# Patient Record
Sex: Female | Born: 1995
Health system: Southern US, Community
[De-identification: ages and names within clinical notes are randomized; demographics above are authoritative.]

## PROBLEM LIST (undated history)

## (undated) DIAGNOSIS — N920 Excessive and frequent menstruation with regular cycle: Secondary | ICD-10-CM

## (undated) DIAGNOSIS — N946 Dysmenorrhea, unspecified: Secondary | ICD-10-CM

## (undated) HISTORY — DX: Dysmenorrhea, unspecified: N94.6

## (undated) HISTORY — DX: Excessive and frequent menstruation with regular cycle: N92.0

---
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
## 2017-01-16 ENCOUNTER — Ambulatory Visit (INDEPENDENT_AMBULATORY_CARE_PROVIDER_SITE_OTHER): Payer: BLUE CROSS/BLUE SHIELD | Admitting: Obstetrics and Gynecology

## 2017-01-16 ENCOUNTER — Encounter: Payer: Self-pay | Admitting: Obstetrics and Gynecology

## 2017-01-16 VITALS — BP 116/68 | Ht 61.0 in | Wt 165.0 lb

## 2017-01-16 DIAGNOSIS — Z3041 Encounter for surveillance of contraceptive pills: Secondary | ICD-10-CM

## 2017-01-16 DIAGNOSIS — N946 Dysmenorrhea, unspecified: Secondary | ICD-10-CM | POA: Insufficient documentation

## 2017-01-16 DIAGNOSIS — R635 Abnormal weight gain: Secondary | ICD-10-CM

## 2017-01-16 DIAGNOSIS — Z01419 Encounter for gynecological examination (general) (routine) without abnormal findings: Secondary | ICD-10-CM

## 2017-01-16 DIAGNOSIS — Z113 Encounter for screening for infections with a predominantly sexual mode of transmission: Secondary | ICD-10-CM

## 2017-01-16 DIAGNOSIS — N920 Excessive and frequent menstruation with regular cycle: Secondary | ICD-10-CM | POA: Insufficient documentation

## 2017-01-16 MED ORDER — NORETHIN ACE-ETH ESTRAD-FE 1-20 MG-MCG PO TABS: 1.0000 | ORAL_TABLET | Freq: Every day | ORAL | 11 refills | Status: DC

## 2017-01-16 NOTE — Progress Notes (Signed)
HPI:      [name] is a 21 y.o. No obstetric history on file. who LMP was Patient's last menstrual period was 12/30/2016 (exact date)., presents today for her annual examination.  Her menses are regular every 28-30 days, lasting 3 days.  Dysmenorrhea none. She does have intermenstrual bleeding with late/missed OCPs.  Sex activity: single partner, contraception - oral OCPs and sometimes condoms.   Hx of STDs: none  There is no FH of breast cancer. There is no FH of ovarian cancer. The patient does not do self-breast exams.  Tobacco use: The patient denies current or previous tobacco use. Alcohol use: none Exercise: moderately active  She does not get adequate calcium and Vitamin D in her diet.  She complains of wt gain over the past few months and increased migraine headaches without aura for the past month. She has increased activity without wt loss. She thinks it's related to OCPs, and denies any change in her eating habits.  Past Medical History:  Diagnosis Date  . Dysmenorrhea   . Menorrhagia     No past surgical history on file.  No family history on file.   ROS:  Review of Systems  Constitutional: Negative for fever, malaise/fatigue and weight loss.  HENT: Negative for congestion, ear pain and sinus pain.   Respiratory: Negative for cough, shortness of breath and wheezing.   Cardiovascular: Negative for chest pain, orthopnea and leg swelling.  Gastrointestinal: Negative for constipation, diarrhea, nausea and vomiting.  Genitourinary: Negative for dysuria, frequency, hematuria and urgency.       Breast ROS: negative   Musculoskeletal: Negative for back pain, joint pain and myalgias.  Skin: Negative for itching and rash.  Neurological: Positive for headaches. Negative for dizziness, tingling and focal weakness.  Endo/Heme/Allergies: Negative for environmental allergies. Does not bruise/bleed easily.  Psychiatric/Behavioral: Negative for depression and  suicidal ideas. The patient is not nervous/anxious and does not have insomnia.     Objective: BP 116/68   Ht 5\' 1"  (1.549 m)   Wt 165 lb (74.8 kg)   LMP 12/30/2016 (Exact Date)   BMI 31.18 kg/m    Physical Exam  Constitutional: She is oriented to person, place, and time. She appears well-developed and well-nourished.  Genitourinary: Vagina normal and uterus normal. No erythema or tenderness in the vagina. No vaginal discharge found. Right adnexum does not display mass and does not display tenderness. Left adnexum does not display mass and does not display tenderness. Cervix does not exhibit motion tenderness or polyp. Uterus is not enlarged or tender.  Neck: Normal range of motion. No thyromegaly present.  Cardiovascular: Normal rate, regular rhythm and normal heart sounds.   No murmur heard. Pulmonary/Chest: Effort normal and breath sounds normal. Right breast exhibits no mass, no nipple discharge, no skin change and no tenderness. Left breast exhibits no mass, no nipple discharge, no skin change and no tenderness.  Abdominal: Soft. There is no tenderness. There is no guarding.  Musculoskeletal: Normal range of motion.  Neurological: She is alert and oriented to person, place, and time. No cranial nerve deficit.  Psychiatric: She has a normal mood and affect. Her behavior is normal.  Vitals reviewed.   Assessment/Plan: Encounter for annual routine gynecological examination  Screening for STD (sexually transmitted disease) - Plan: Chlamydia/Gonococcus/Trichomonas, NAA  Encounter for surveillance of contraceptive pills - Change OCPs to see if helps sx. Pt not interested in depo/nexplanon/IUD. - Plan: norethindrone-ethinyl estradiol (JUNEL FE 1/20) 1-20 MG-MCG tablet  Weight gain - 21# increase since last wk. Pt to monitor caloric intake/increased exercise/wt loss. Try different OCPs (most likely not related). F/u if no wt loss for labs.               GYN counsel STD prevention,  use and side effects of OCP's, adequate intake of calcium and vitamin D     F/U  Return in about 1 year (around 01/16/2018).  Latrise Bowland B. Gaynelle Pastrana, PA-C 01/16/2017 12:20 PM

## 2017-01-18 LAB — CHLAMYDIA/GONOCOCCUS/TRICHOMONAS, NAA
Chlamydia by NAA: NEGATIVE
Gonococcus by NAA: NEGATIVE
Trich vag by NAA: NEGATIVE

## 2017-03-24 ENCOUNTER — Emergency Department
Admission: EM | Admit: 2017-03-24 | Discharge: 2017-03-24 | Disposition: A | Discharge status: Home / Self Care | Payer: Self-pay | Attending: Emergency Medicine | Admitting: Emergency Medicine

## 2017-03-24 ENCOUNTER — Emergency Department: Payer: BLUE CROSS/BLUE SHIELD

## 2017-03-24 ENCOUNTER — Encounter: Payer: Self-pay | Admitting: Emergency Medicine

## 2017-03-24 DIAGNOSIS — N83209 Unspecified ovarian cyst, unspecified side: Secondary | ICD-10-CM

## 2017-03-24 DIAGNOSIS — R1031 Right lower quadrant pain: Secondary | ICD-10-CM

## 2017-03-24 DIAGNOSIS — N83202 Unspecified ovarian cyst, left side: Secondary | ICD-10-CM | POA: Insufficient documentation

## 2017-03-24 DIAGNOSIS — R102 Pelvic and perineal pain: Secondary | ICD-10-CM

## 2017-03-24 LAB — COMPREHENSIVE METABOLIC PANEL
ALK PHOS: 52 U/L (ref 38–126)
ALT: 16 U/L (ref 14–54)
AST: 21 U/L (ref 15–41)
Albumin: 4.4 g/dL (ref 3.5–5.0)
Anion gap: 6 (ref 5–15)
BUN: 15 mg/dL (ref 6–20)
CALCIUM: 9.4 mg/dL (ref 8.9–10.3)
CO2: 25 mmol/L (ref 22–32)
CREATININE: 0.71 mg/dL (ref 0.44–1.00)
Chloride: 106 mmol/L (ref 101–111)
GFR calc Af Amer: >60 mL/min (ref >60)
GFR calc non Af Amer: >60 mL/min (ref >60)
GLUCOSE: 92 mg/dL (ref 65–99)
Potassium: 4 mmol/L (ref 3.5–5.1)
SODIUM: 137 mmol/L (ref 135–145)
Total Bilirubin: 0.7 mg/dL (ref 0.3–1.2)
Total Protein: 7.5 g/dL (ref 6.5–8.1)

## 2017-03-24 LAB — URINALYSIS, COMPLETE (UACMP) WITH MICROSCOPIC
Bacteria, UA: NONE SEEN
Bilirubin Urine: NEGATIVE
Glucose, UA: NEGATIVE mg/dL
Hgb urine dipstick: NEGATIVE
KETONES UR: 5 mg/dL — AB
Leukocytes, UA: NEGATIVE
Nitrite: NEGATIVE
PH: 6 (ref 5.0–8.0)
Protein, ur: NEGATIVE mg/dL
SPECIFIC GRAVITY, URINE: 1.028 (ref 1.005–1.030)

## 2017-03-24 LAB — CBC
HCT: 41.6 % (ref 35.0–47.0)
Hemoglobin: 14.7 g/dL (ref 12.0–16.0)
MCH: 31.9 pg (ref 26.0–34.0)
MCHC: 35.3 g/dL (ref 32.0–36.0)
MCV: 90.4 fL (ref 80.0–100.0)
PLATELETS: 293 10*3/uL (ref 150–440)
RBC: 4.61 MIL/uL (ref 3.80–5.20)
RDW: 12 % (ref 11.5–14.5)
WBC: 6.5 10*3/uL (ref 3.6–11.0)

## 2017-03-24 LAB — POCT PREGNANCY, URINE: Preg Test, Ur: NEGATIVE

## 2017-03-24 LAB — LIPASE, BLOOD: LIPASE: 25 U/L (ref 11–51)

## 2017-03-24 MED ORDER — NAPROXEN SODIUM 550 MG PO TABS: 550.0000 mg | ORAL_TABLET | Freq: Two times a day (BID) | ORAL | 0 refills | Status: DC

## 2017-03-24 NOTE — ED Notes (Signed)
See triage note  States she woke up with lower abd cramping this am  States she had some intermittent lower back pain yesterday  describes  Pain as cramping  And pain was increased with standing and walking  No fever or n/v

## 2017-03-24 NOTE — ED Provider Notes (Signed)
[facility] Emergency Department Provider Note   ____________________________________________   First MD Initiated Contact with Patient 03/24/17 1148     (approximate)  I have reviewed the triage vital signs and the nursing notes.   HISTORY  Chief Complaint Abdominal Pain Mother   HPI Robin Heath is a 21 y.o. female is complaining of lower abdominal pain after waking up smiling. Patient reports it feels like a crampy, burning sensation. She denies any vaginal difficulties, vaginal discharge or urinary symptoms. Patient states that she took 3 ibuprofen this morning with some relief. She reports some nausea. There is been no vomiting or diarrhea. She denies any fever or chills. She was seen at [facility] and sent to the emergency department. Mother was also with patient and states that she went for her physical recently after being on birth control pills for approximately 1 year. Physical Exam was normal.  She was placed on birth control to help control her periods which were extremely heavy and painful causing her to miss school. Currently she rates her pain is 3/10.    Past Medical History:  Diagnosis Date  . Dysmenorrhea   . Menorrhagia     Patient Active Problem List   Diagnosis Date Noted  . Dysmenorrhea 01/16/2017  . Menorrhagia 01/16/2017    History reviewed. No pertinent surgical history.  Prior to Admission medications   Medication Sig Start Date End Date Taking? Authorizing Provider  naproxen sodium (ANAPROX DS) 550 MG tablet Take 1 tablet (550 mg total) by mouth 2 (two) times daily with a meal. 03/24/17   Tommi Rumps, PA-C  norethindrone-ethinyl estradiol (JUNEL FE 1/20) 1-20 MG-MCG tablet Take 1 tablet by mouth daily. 01/16/17   Copland, Ilona Sorrel, PA-C    Allergies Patient has no known allergies.  No family history on file.  Social History Social History  Substance Use Topics  . Smoking status: Never Smoker  .  Smokeless tobacco: Never Used  . Alcohol use No    Review of Systems Constitutional: No fever/chills Eyes: No visual changes. Cardiovascular: Denies chest pain. Respiratory: Denies shortness of breath. Gastrointestinal: Positive abdominal pain.  Positive nausea, no vomiting.  Musculoskeletal: Negative for back pain. Skin: Negative for rash. Neurological: Negative for headaches, focal weakness or numbness.  ____________________________________________   PHYSICAL EXAM:  VITAL SIGNS: ED Triage Vitals  Enc Vitals Group     BP 03/24/17 1041 117/67     Pulse Rate 03/24/17 1041 (!) 107     Resp 03/24/17 1041 17     Temp 03/24/17 1041 98.4 F (36.9 C)     Temp Source 03/24/17 1041 Oral     SpO2 03/24/17 1041 98 %     Weight 03/24/17 1042 160 lb (72.6 kg)     Height 03/24/17 1042 5\' 1"  (1.549 m)     Head Circumference --      Peak Flow --      Pain Score 03/24/17 1041 3     Pain Loc --      Pain Edu? --      Excl. in GC? --     Constitutional: Alert and oriented. Well appearing and in no acute distress. Eyes: Conjunctivae are normal. PERRL. EOMI. Head: Atraumatic. Neck: No stridor.   Cardiovascular: Normal rate, regular rhythm. Grossly normal heart sounds.  Good peripheral circulation. Respiratory: Normal respiratory effort.  No retractions. Lungs CTAB. Gastrointestinal: Soft and nontender. No distention.  No CVA tenderness.Bowel sounds are normoactive 4 quadrants. There is  some diffuse tenderness below the umbilicus and suprapubic area. There is both right and left lower quadrant tenderness with the right slightly greater than left. There is no McBurney's tenderness. No rebound tenderness. Musculoskeletal: Moves upper and lower extremities without any difficulty. No edema noted lower extremities. Neurologic:  Normal speech and language. No gross focal neurologic deficits are appreciated.  Skin:  Skin is warm, dry and intact. No rash noted. Psychiatric: Mood and affect are  normal. Speech and behavior are normal.  ____________________________________________   LABS (all labs ordered are listed, but only abnormal results are displayed)  Labs Reviewed  URINALYSIS, COMPLETE (UACMP) WITH MICROSCOPIC - Abnormal; Notable for the following:       Result Value   Color, Urine YELLOW (*)    APPearance CLEAR (*)    Ketones, ur 5 (*)    Squamous Epithelial / LPF 0-5 (*)    All other components within normal limits  LIPASE, BLOOD  COMPREHENSIVE METABOLIC PANEL  CBC  POC URINE PREG, ED  POCT PREGNANCY, URINE    RADIOLOGY  Transvaginal ultrasound per radiologist: IMPRESSION:  1. Moderate volume of hypoechoic complex free fluid in the pelvis.  Given an irregular hypoechoic thinly septated complex appearing cyst  associated with the left ovary measuring 3.7 x 4.1 x 4.9 cm,  findings could potentially be secondary to a ruptured left ovarian  cyst. As always, correlate to exclude pregnancy.  2. Unremarkable appearance of the right ovary.      ____________________________________________   PROCEDURES  Procedure(s) performed: None  Procedures  Critical Care performed: No  ____________________________________________   INITIAL IMPRESSION / ASSESSMENT AND PLAN / ED COURSE  Pertinent labs & imaging results that were available during my care of the patient were reviewed by me and considered in my medical decision making (see chart for details).  Discussed findings with patient and mother. Patient is given a prescription for Anaprox DS as needed for inflammation and pain. 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 Patient continues to rest in the exam room without any increased pain. She denies any nausea or vomiting. There is been no fever or chills. We discussed symptoms for appendicitis and patient is aware to return to the emergency room immediately if her symptoms worsen, nausea or vomiting, fever or chills,  or severe worsening of her pain. She will follow-up with her PCP if any continued problems.      ____________________________________________   FINAL CLINICAL IMPRESSION(S) / ED DIAGNOSES  Final diagnoses:  Pelvic pain  Ruptured ovarian cyst      NEW MEDICATIONS STARTED DURING THIS VISIT:  Discharge Medication List as of 03/24/2017  4:02 PM    START taking these medications   Details  naproxen sodium (ANAPROX DS) 550 MG tablet Take 1 tablet (550 mg total) by mouth 2 (two) times daily with a meal., Starting Mon 03/24/2017, Print         Note:  This document was prepared using Dragon voice recognition software and may include unintentional dictation errors.    Tommi RumpsSummers, Ples Trudel L, PA-C 03/24/17 1723    Arnaldo NatalMalinda, Paul F, MD 03/25/17 2322

## 2017-03-24 NOTE — Discharge Instructions (Signed)
Begin taking Anaprox as needed for discomfort. Follow-up with your doctor if any continued problems. Return to the emergency room immediately if any severe worsening of her symptoms or localized pain in the right lower quadrant. Nausea, vomiting, fever, or severe pain.

## 2017-03-24 NOTE — ED Notes (Signed)
Pt given gown and ask to remove clothing from waist down. Pt parent in room with pt.

## 2017-03-24 NOTE — ED Triage Notes (Signed)
Pt reports lower abdominal pain upon awakening this morning. Pt reports pain feels like cramping/burning. Pt reports taking 3 ibu this morning with some relief. Pt reports nausea. [facility]

## 2017-08-25 ENCOUNTER — Encounter: Payer: Self-pay | Admitting: Obstetrics and Gynecology

## 2017-08-25 ENCOUNTER — Ambulatory Visit (INDEPENDENT_AMBULATORY_CARE_PROVIDER_SITE_OTHER): Payer: Self-pay | Admitting: Obstetrics and Gynecology

## 2017-08-25 VITALS — BP 122/88 | HR 98 | Ht 61.0 in | Wt 173.0 lb

## 2017-08-25 DIAGNOSIS — Z1329 Encounter for screening for other suspected endocrine disorder: Secondary | ICD-10-CM

## 2017-08-25 DIAGNOSIS — R4586 Emotional lability: Secondary | ICD-10-CM

## 2017-08-25 DIAGNOSIS — Z131 Encounter for screening for diabetes mellitus: Secondary | ICD-10-CM

## 2017-08-25 DIAGNOSIS — N938 Other specified abnormal uterine and vaginal bleeding: Secondary | ICD-10-CM

## 2017-08-25 DIAGNOSIS — R635 Abnormal weight gain: Secondary | ICD-10-CM

## 2017-08-25 DIAGNOSIS — Z3041 Encounter for surveillance of contraceptive pills: Secondary | ICD-10-CM

## 2017-08-25 DIAGNOSIS — Z0000 Encounter for general adult medical examination without abnormal findings: Secondary | ICD-10-CM

## 2017-08-25 MED ORDER — LEVONORGESTREL-ETHINYL ESTRAD 0.1-20 MG-MCG PO TABS: 1.0000 | ORAL_TABLET | Freq: Every day | ORAL | 1 refills | Status: DC

## 2017-08-25 NOTE — Progress Notes (Addendum)
Chief Complaint  Patient presents with  . Menstrual Problem    HPI:      [name] is a 21 y.o. G0P0000 who LMP was Patient's last menstrual period was 07/19/2017., presents today for BTB on OCPs since 5/18. She had monthly menses lasting 3 days on aviane, but OCPs changed to junel 1/20 due to possible cause of wt gain 3/18. Since then, pt's menses have still been monthly but she has been having 3-4 days BTB each month. No late/missed OCPs. No pelvic pain, no urin sx, no vag sx. She is occas sex active, no new partners since 3/18. Had 2 neg UPTs. Had a poss ruptured RT ovar cyst 5/18.  Pt has also gained 8# since 3/18. She had gained about 19# from 8/17-3/18. She denies any diet/exercise changes. She complains of mood changes, worse on the junel 1/20 OCPs. No relation to cycle. Just has anger/agitation/emotional lability randomly, no aggrav factors. No increased stress.  Past Medical History:  Diagnosis Date  . Dysmenorrhea   . Menorrhagia     History reviewed. No pertinent surgical history.  History reviewed. No pertinent family history.  Social History   Social History  . Marital status: Single    Spouse name: N/A  . Number of children: N/A  . Years of education: N/A   Occupational History  . Not on file.   Social History Main Topics  . Smoking status: Never Smoker  . Smokeless tobacco: Never Used  . Alcohol use No  . Drug use: No  . Sexual activity: Yes    Birth control/ protection: Pill   Other Topics Concern  . Not on file   Social History Narrative  . No narrative on file     Current Outpatient Prescriptions:  .  levonorgestrel-ethinyl estradiol (AVIANE) 0.1-20 MG-MCG tablet, Take 1 tablet by mouth daily., Disp: 84 tablet, Rfl: 1 .  naproxen sodium (ANAPROX DS) 550 MG tablet, Take 1 tablet (550 mg total) by mouth 2 (two) times daily with a meal. (Patient not taking: Reported on 08/25/2017), Disp: 12 tablet, Rfl: 0   ROS:  Review of Systems    Constitutional: Negative for fever.  Gastrointestinal: Negative for blood in stool, constipation, diarrhea, nausea and vomiting.  Genitourinary: Negative for dyspareunia, dysuria, flank pain, frequency, hematuria, urgency, vaginal bleeding, vaginal discharge and vaginal pain.  Musculoskeletal: Negative for back pain.  Skin: Negative for rash.     OBJECTIVE:   Vitals:  BP 122/88   Pulse 98   Ht 5\' 1"  (1.549 m)   Wt 173 lb (78.5 kg)   LMP 07/19/2017   BMI 32.69 kg/m   Physical Exam  Constitutional: She is oriented to person, place, and time and well-developed, well-nourished, and in no distress.  Neurological: She is alert and oriented to person, place, and time.  Psychiatric: Memory, affect and judgment normal.  Vitals reviewed.    Assessment/Plan: DUB (dysfunctional uterine bleeding) - Check labs. Most likely due to OCP change. Restart aviane (pt had good cycle control on it). Rx eRxd. F/u prn.  - Plan: TSH + free T4, Prolactin, Hemoglobin A1c  Weight gain - Wt gain since 8/17. Check labs. Will f/u with results. Discussed [employer]/exercise changes.   Blood tests for routine general physical examination - Plan: TSH + free T4, Prolactin, Hemoglobin A1c  Thyroid disorder screening - Plan: TSH + free T4  Screening for diabetes mellitus - Plan: Hemoglobin A1c  Encounter for surveillance of contraceptive pills - Plan: levonorgestrel-ethinyl estradiol (  AVIANE) 0.1-20 MG-MCG tablet  Mood swings - See if sx improve wtih OCP change.    Meds ordered this encounter  Medications  . levonorgestrel-ethinyl estradiol (AVIANE) 0.1-20 MG-MCG tablet    Sig: Take 1 tablet by mouth daily.    Dispense:  84 tablet    Refill:  1      Return if symptoms worsen or fail to improve.  Alicia B. Copland, PA-C 08/25/2017 11:46 AM

## 2017-08-26 LAB — TSH+FREE T4
FREE T4: 1.26 ng/dL (ref 0.93–1.60)
TSH: 2.2 u[IU]/mL (ref 0.450–4.500)

## 2017-08-26 LAB — HEMOGLOBIN A1C
Est. average glucose Bld gHb Est-mCnc: 94 mg/dL
Hgb A1c MFr Bld: 4.9 % (ref 4.8–5.6)

## 2017-08-26 LAB — PROLACTIN: Prolactin: 12.4 ng/mL (ref 4.8–23.3)

## 2018-02-01 ENCOUNTER — Other Ambulatory Visit: Payer: Self-pay | Admitting: Obstetrics and Gynecology

## 2018-02-01 DIAGNOSIS — Z3041 Encounter for surveillance of contraceptive pills: Secondary | ICD-10-CM

## 2018-02-04 ENCOUNTER — Other Ambulatory Visit: Payer: Self-pay

## 2018-02-04 DIAGNOSIS — Z3041 Encounter for surveillance of contraceptive pills: Secondary | ICD-10-CM

## 2018-02-04 MED ORDER — LEVONORGESTREL-ETHINYL ESTRAD 0.1-20 MG-MCG PO TABS: 1.0000 | ORAL_TABLET | Freq: Every day | ORAL | 0 refills | Status: DC

## 2018-02-04 NOTE — Telephone Encounter (Signed)
Pt sched annual and needs refill of bc.  Pt aware refill eRx'd.

## 2018-02-25 ENCOUNTER — Ambulatory Visit: Payer: Self-pay | Admitting: Obstetrics and Gynecology

## 2018-03-10 ENCOUNTER — Ambulatory Visit: Payer: Self-pay | Admitting: Obstetrics and Gynecology

## 2018-03-27 IMAGING — US US TRANSVAGINAL NON-OB
1 series · 13 of 25 positions shown · non-contrast
Comparison: None

CLINICAL DATA: Right lower quadrant pelvic pain since 9 a.m. today.

EXAM:
TRANSABDOMINAL AND TRANSVAGINAL ULTRASOUND OF PELVIS
TECHNIQUE: Both transabdominal and transvaginal ultrasound examinations of the
pelvis were performed. Transabdominal technique was performed for
global imaging of the pelvis including uterus, ovaries, adnexal
regions, and pelvic cul-de-sac. It was necessary to proceed with
endovaginal exam following the transabdominal exam to visualize the
ovaries and adnexa.

[Series 1: us transvaginal non-ob · 0.17mm/px · 13 of 83 slices shown]
[im 1/83]
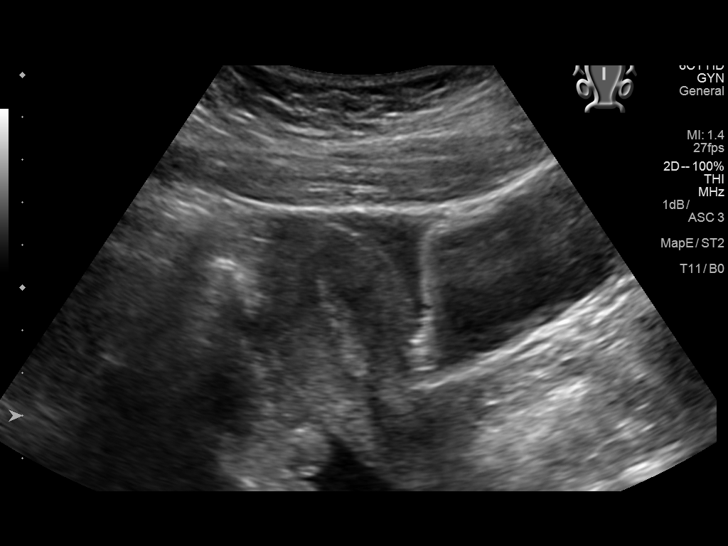
[im 7/83]
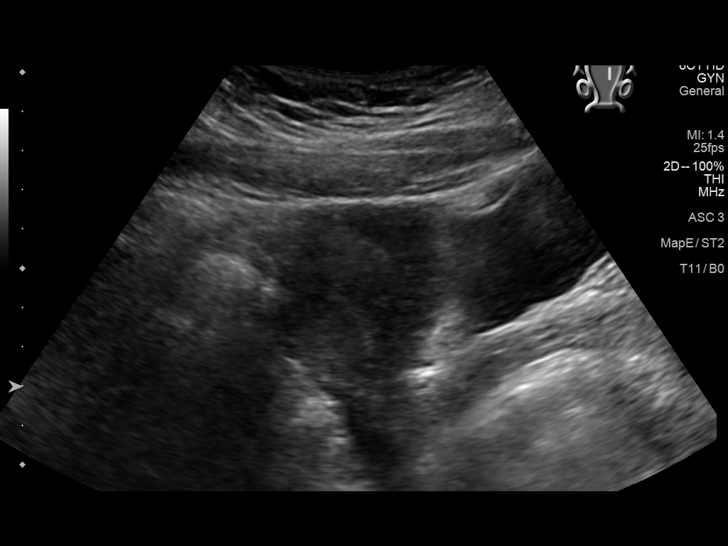
[im 14/83]
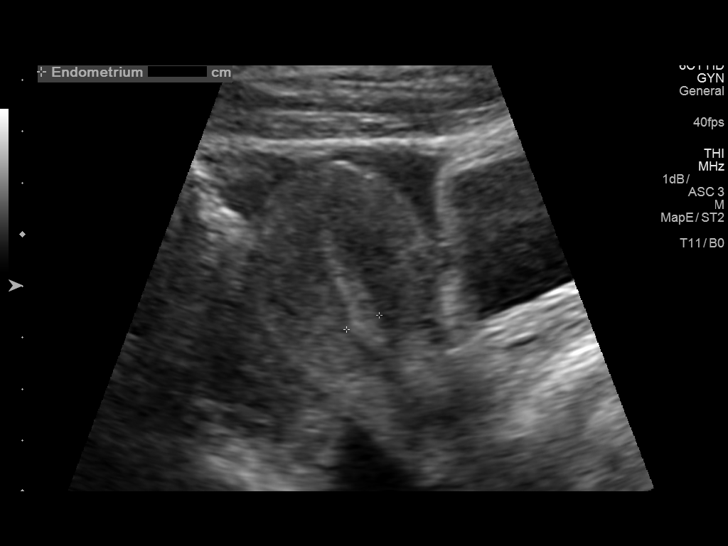
[im 21/83]
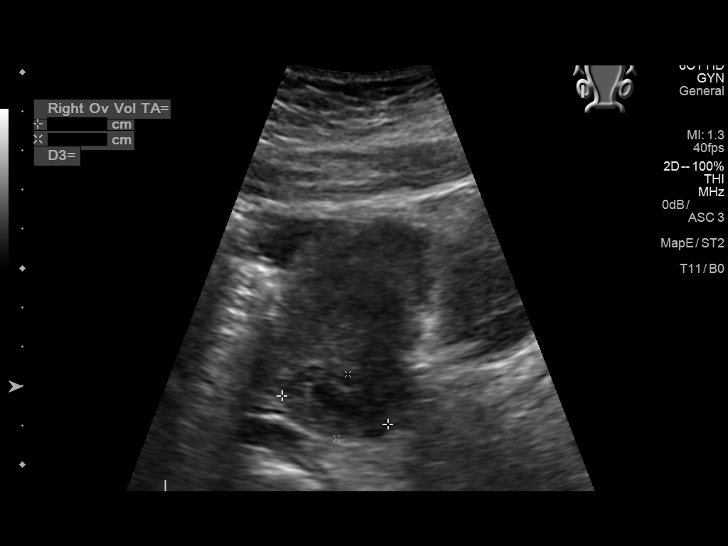
[im 28/83]
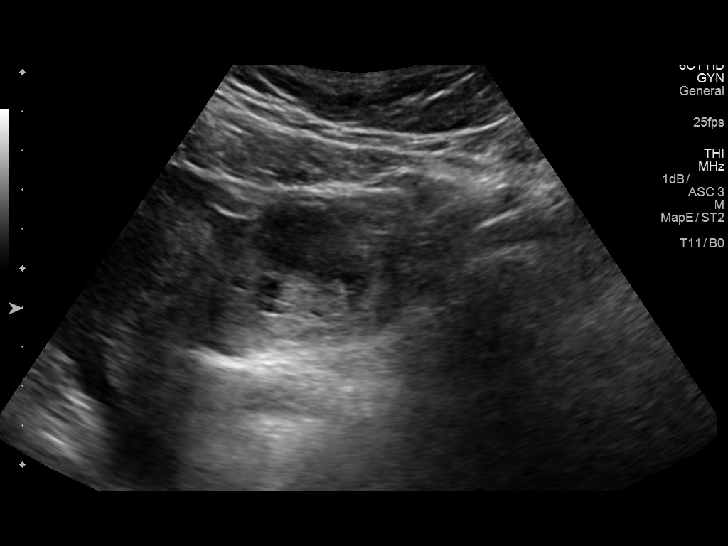
[im 35/83]
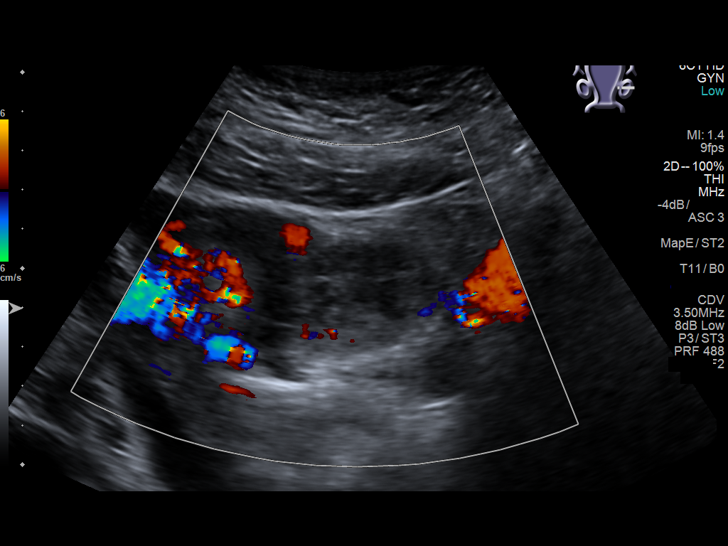
[im 42/83]
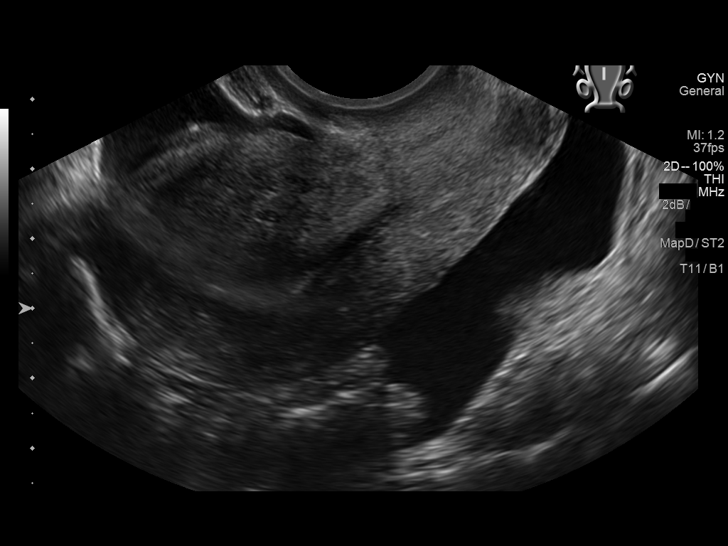
[im 48/83]
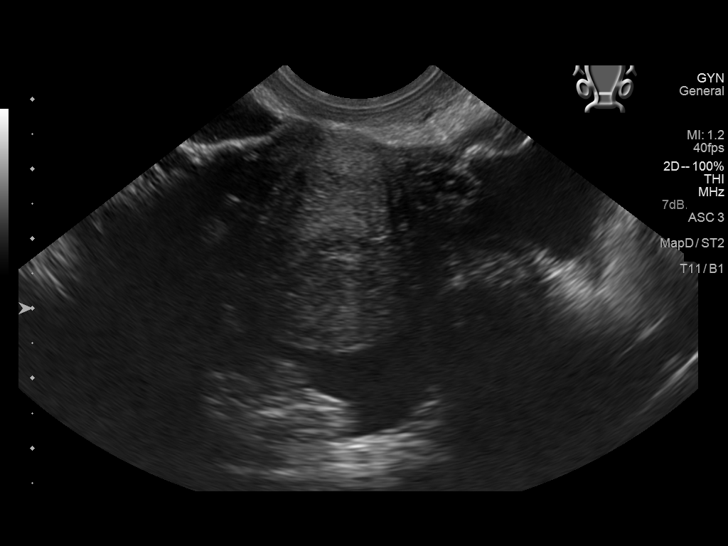
[im 55/83]
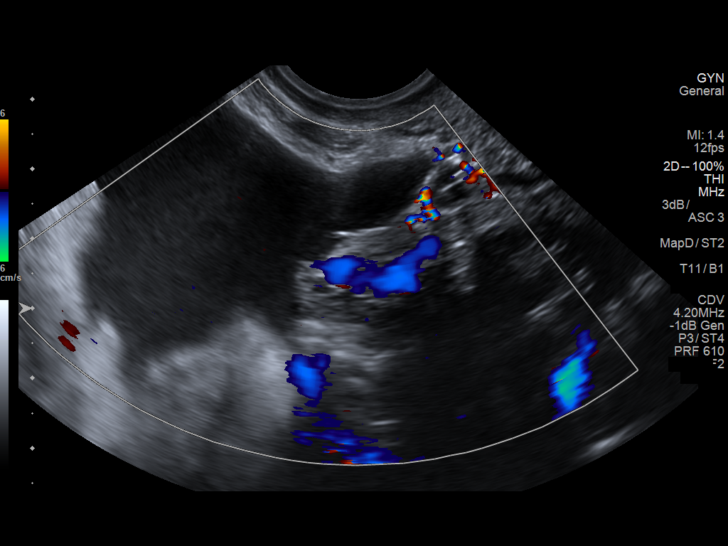
[im 62/83]
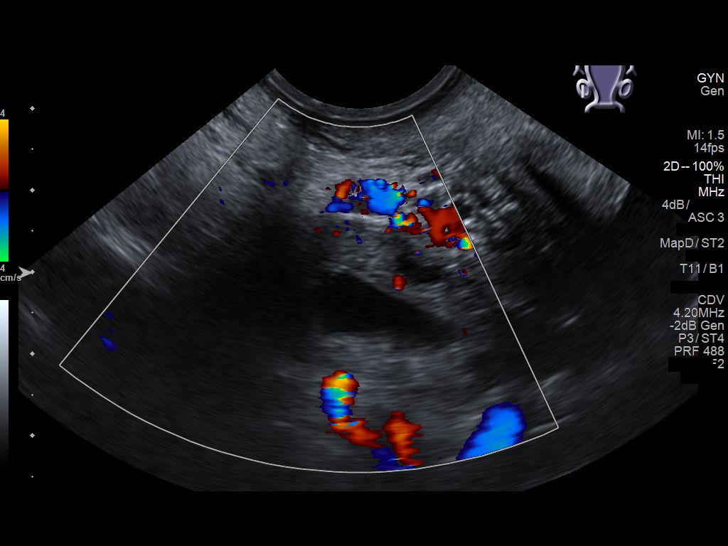
[im 69/83]
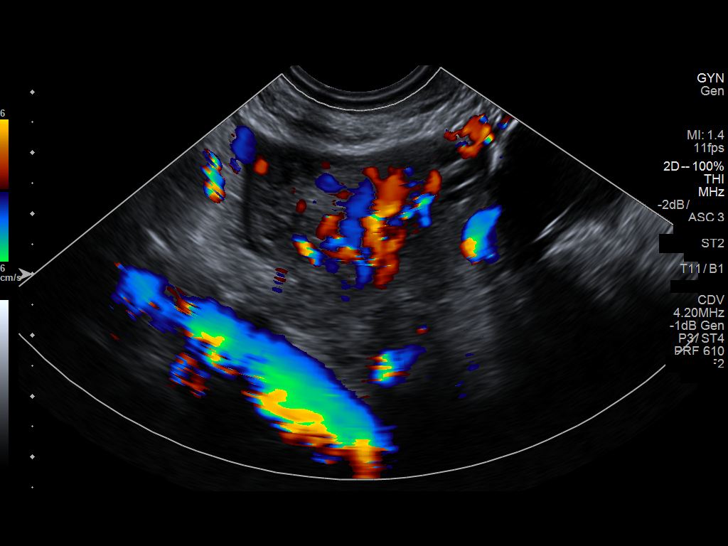
[im 76/83]
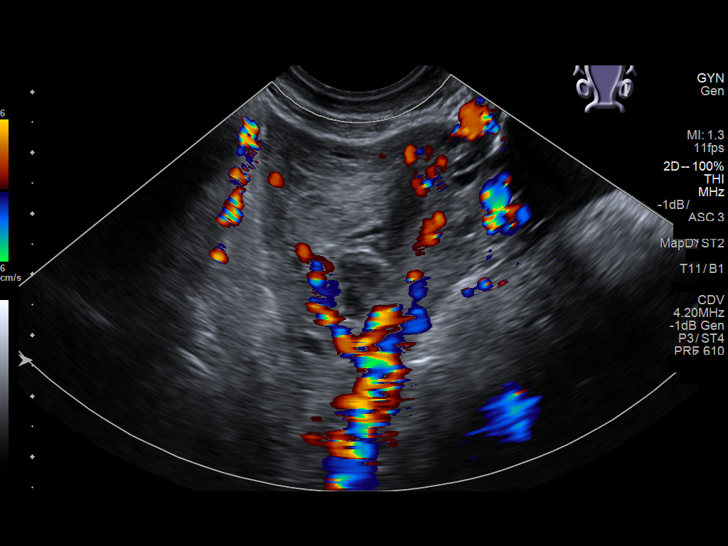
[im 83/83]
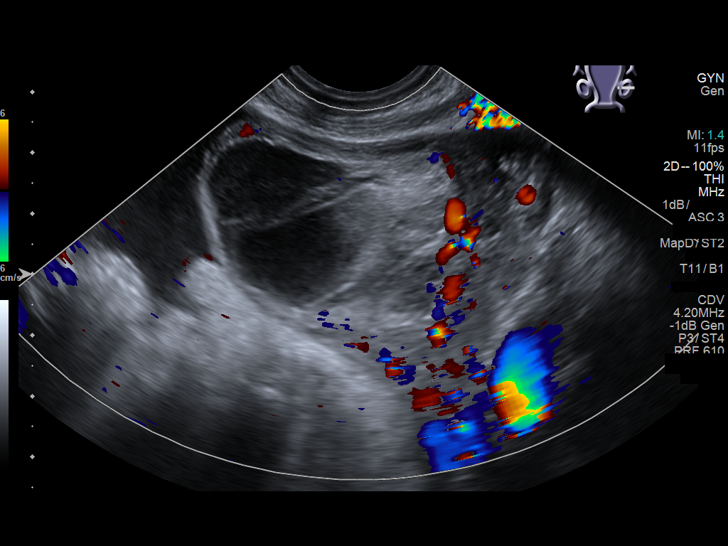

[13 of 25 positions shown; findings below may reference images not displayed]

FINDINGS: Uterus

Measurements: 7.2 x 3.6 x 3.5 cm. The uterus is slightly anteverted.
No fibroids or other mass visualized.

Endometrium

Thickness: 7 mm.  No focal abnormality visualized.

Right ovary

Measurements: 3.3 x 1.7 x 1.5 cm. Normal appearance/no adnexal mass.
Color Doppler flow is demonstrated to the right ovary.

Left ovary

Measurements: 4.3 x 5.2 x 5.6 cm. There is an irregular hypoechoic
thinly septated complex appearing cyst measuring 3.7 x 4.1 x 4.9 cm.
Color Doppler flow is documented to the left ovary.

Other findings

A moderate volume of hypoechoic complex fluid with internal debris
is noted. Findings may reflect sequela of a ruptured cyst more
likely from the left ovary given an irregular hypoechoic complex
cyst associated with the left ovary as noted above.
IMPRESSION: 1. Moderate volume of hypoechoic complex free fluid in the pelvis.
Given an irregular hypoechoic thinly septated complex appearing cyst
associated with the left ovary measuring 3.7 x 4.1 x 4.9 cm,
findings could potentially be secondary to a ruptured left ovarian
cyst. As always, correlate to exclude pregnancy.
2. Unremarkable appearance of the right ovary.

## 2018-04-27 ENCOUNTER — Other Ambulatory Visit: Payer: Self-pay | Admitting: Obstetrics and Gynecology

## 2018-04-27 DIAGNOSIS — Z3041 Encounter for surveillance of contraceptive pills: Secondary | ICD-10-CM

## 2019-05-20 ENCOUNTER — Encounter: Payer: Self-pay | Admitting: Maternal Newborn

## 2020-11-07 ENCOUNTER — Other Ambulatory Visit: Payer: BLUE CROSS/BLUE SHIELD

## 2021-05-09 DIAGNOSIS — Z01419 Encounter for gynecological examination (general) (routine) without abnormal findings: Secondary | ICD-10-CM | POA: Insufficient documentation

## 2021-05-09 DIAGNOSIS — F418 Other specified anxiety disorders: Secondary | ICD-10-CM | POA: Insufficient documentation

## 2021-12-27 ENCOUNTER — Other Ambulatory Visit: Payer: Self-pay

## 2021-12-27 ENCOUNTER — Encounter: Payer: Self-pay | Admitting: Emergency Medicine

## 2021-12-27 ENCOUNTER — Ambulatory Visit
Admission: EM | Admit: 2021-12-27 | Discharge: 2021-12-27 | Disposition: A | Discharge status: Home / Self Care | Payer: Medicaid Other | Attending: Emergency Medicine | Admitting: Emergency Medicine

## 2021-12-27 DIAGNOSIS — J029 Acute pharyngitis, unspecified: Secondary | ICD-10-CM | POA: Insufficient documentation

## 2021-12-27 LAB — GROUP A STREP BY PCR: Group A Strep by PCR: NOT DETECTED

## 2021-12-27 MED ORDER — IBUPROFEN 600 MG PO TABS: 600.0000 mg | ORAL_TABLET | Freq: Four times a day (QID) | ORAL | 0 refills | Status: AC | PRN

## 2021-12-27 NOTE — ED Provider Notes (Signed)
?HPI ? ?SUBJECTIVE: ? ?Patient reports left-sided sore throat starting 4 days ago. Sx worse with swallowing.  Sx partially better with Advil 400 mg. Has been taking Chloraseptic w/ o relief.  ?+ Fever tmax 100.2 ?+ Swollen neck glands left side   ?No neck stiffness, but reports pain with turning head to the left  ?No Cough ?No nasal congestion, rhinorrhea ?No Myalgias ?No Headache ?No Rash ? ?No loss of taste or smell ?No shortness of breath or difficulty breathing ?No nausea, vomiting ?No diarrhea ?No abdominal pain ?    ?No Recent Strep, flu, COVID exposure ?She got 2 doses of the COVID-vaccine.  Did not get this years flu vaccine. ? ?No Breathing difficulty, voice changes, sensation of throat swelling shut ?No Drooling ?No Trismus ?No abx in past month.  ?+ Ibuprofen in past 4-6 hrs ?She has no past medical history ?LMP: Last week.  Denies possibility being pregnant.  She is breast-feeding ?PCP: [facility] ? ? ?Past Medical History:  ?Diagnosis Date  ? Dysmenorrhea   ? Menorrhagia   ? ? ?History reviewed. No pertinent surgical history. ? ?Family History  ?Problem Relation Age of Onset  ? Healthy Mother   ? ? ?Social History  ? ?Tobacco Use  ? Smoking status: Never  ? Smokeless tobacco: Never  ?Vaping Use  ? Vaping Use: Never used  ?Substance Use Topics  ? Alcohol use: No  ? Drug use: No  ? ? ?No current facility-administered medications for this encounter. ? ?Current Outpatient Medications:  ?  ibuprofen (ADVIL) 600 MG tablet, Take 1 tablet (600 mg total) by mouth every 6 (six) hours as needed., Disp: 30 tablet, Rfl: 0 ?  SRONYX 0.1-20 MG-MCG tablet, TAKE 1 TABLET BY MOUTH EVERY DAY, Disp: 84 tablet, Rfl: 0 ? ?No Known Allergies ? ? ?ROS ? ?As noted in HPI.  ? ?Physical Exam ? ?BP (!) 145/96 (BP Location: Right Arm)   Pulse (!) 115   Temp 100.2 ?F (37.9 ?C) (Oral)   Resp 18   Ht 5\' 1"  (1.549 m)   Wt 78.5 kg   SpO2 98%   BMI 32.70 kg/m?  ? ?Constitutional: Well developed, well nourished, no acute  distress ?Eyes:  EOMI, conjunctiva normal bilaterally ?HENT: Normocephalic, atraumatic,mucus membranes moist.  No nasal congestion.  Erythematous oropharynx, worse on the left side.  Swollen left tonsil with grayish-white exudates.  Uvula midline.  No soft palate swelling.  No drooling, trismus, muffled voice.  ?Neck: No neck stiffness. ?Respiratory: Normal inspiratory effort ?Cardiovascular: Regular tachycardia, no murmurs, rubs, gallops ?GI: nondistended, nontender. No appreciable splenomegaly ?skin: No rash, skin intact ?Lymph: Positive large left-sided tender anterior cervical LN.  No posterior cervical lymphadenopathy ?Musculoskeletal: no deformities ?Neurologic: Alert & oriented x 3, no focal neuro deficits ?Psychiatric: Speech and behavior appropriate.  ? ?ED Course ? ? ?Medications - No data to display ? ?Orders Placed This Encounter  ?Procedures  ? Group A Strep by PCR  ?  Standing Status:   Standing  ?  Number of Occurrences:   1  ? ? ?Results for orders placed or performed during the hospital encounter of 12/27/21 (from the past 24 hour(s))  ?Group A Strep by PCR     Status: None  ? Collection Time: 12/27/21  5:31 PM  ? Specimen: Throat; Sterile Swab  ?Result Value Ref Range  ? Group A Strep by PCR NOT DETECTED NOT DETECTED  ? ?No results found. ? ?ED Clinical Impression ? ?1. Exudative pharyngitis   ? ? ? ?  ED Assessment/Plan ? ?Strep PCR negative.  Patient with a exudative, most likely viral, pharyngitis.  No evidence of peritonsillar abscess at this time, although discussed with patient that it could develop into one.  Patient home with ibuprofen, Tylenol, Benadryl/Maalox mixture.. Patient to followup with PMD when necessary, strict ER return precautions given. ? ?Discussed labs,  MDM, plan and followup with patient. Discussed sn/sx that should prompt return to the ED. patient agrees with plan.  ? ?Meds ordered this encounter  ?Medications  ? ibuprofen (ADVIL) 600 MG tablet  ?  Sig: Take 1 tablet (600  mg total) by mouth every 6 (six) hours as needed.  ?  Dispense:  30 tablet  ?  Refill:  0  ? ? ? ?*This clinic note was created using Scientist, clinical (histocompatibility and immunogenetics). Therefore, there may be occasional mistakes despite careful proofreading. ?  ?  ?Domenick Gong, MD ?12/27/21 1812 ? ?

## 2021-12-27 NOTE — ED Triage Notes (Signed)
Pt reports a sore throat that is only affecting the left side x4 days. States she believes tonsils are infected.  ?

## 2021-12-27 NOTE — Discharge Instructions (Addendum)
Your strep PCR was negative.  This is most likely a viral infection.  1000 milligram of Tylenol and 600 mg ibuprofen together 3-4 times a day as needed for pain.  Make sure you drink plenty of extra fluids.  Some people find salt water gargles and  Traditional Medicinal's "Throat Coat" tea helpful. Take 5 mL of liquid Benadryl and 5 mL of Maalox. Mix it together, and then hold it in your mouth for as long as you can and then swallow. You may do this 4 times a day.   ? ?Go to www.goodrx.com  or www.costplusdrugs.com to look up your medications. This will give you a list of where you can find your prescriptions at the most affordable prices. Or ask the pharmacist what the cash price is, or if they have any other discount programs available to help make your medication more affordable. This can be less expensive than what you would pay with insurance.   ?

## 2022-12-18 DIAGNOSIS — N760 Acute vaginitis: Secondary | ICD-10-CM | POA: Insufficient documentation

## 2022-12-20 ENCOUNTER — Encounter: Payer: Self-pay | Admitting: Podiatry

## 2022-12-20 ENCOUNTER — Ambulatory Visit: Payer: Medicaid Other | Admitting: Podiatry

## 2022-12-20 DIAGNOSIS — L600 Ingrowing nail: Secondary | ICD-10-CM | POA: Diagnosis not present

## 2022-12-20 NOTE — Progress Notes (Signed)
  Subjective:  Patient ID: Robin Heath, female    DOB: 04-10-1996,  MRN: [id number]  Chief Complaint  Patient presents with   Nail Problem    Pt stated she is not sure that she has an ingrown the toe is just red and she has no pain     27 y.o. female presents with the above complaint.  Patient presents with right hallux medial border ingrown painful to touch is progressive gotten worse worse with ambulation worse with pressure she has not seen anyone as prior to seeing me she denies any other acute complaints would like to have removed she has she states pain scale is 5 out of 10 dull achy in nature   Review of Systems: Negative except as noted in the HPI. Denies N/V/F/Ch.  Past Medical History:  Diagnosis Date   Dysmenorrhea    Menorrhagia     Current Outpatient Medications:    brompheniramine-pseudoephedrine-DM 30-2-10 MG/5ML syrup, Take by mouth., Disp: , Rfl:    JUNEL FE 1/20 1-20 MG-MCG tablet, Take 1 tablet by mouth daily., Disp: , Rfl:    norethindrone-ethinyl estradiol-iron (LOESTRIN FE) 1.5-30 MG-MCG tablet, Take 1 tablet by mouth daily., Disp: , Rfl:    ibuprofen (ADVIL) 600 MG tablet, Take 1 tablet (600 mg total) by mouth every 6 (six) hours as needed., Disp: 30 tablet, Rfl: 0   SRONYX 0.1-20 MG-MCG tablet, TAKE 1 TABLET BY MOUTH EVERY DAY, Disp: 84 tablet, Rfl: 0  Social History   Tobacco Use  Smoking Status Never  Smokeless Tobacco Never    No Known Allergies Objective:  There were no vitals filed for this visit. There is no height or weight on file to calculate BMI. Constitutional Well developed. Well nourished.  Vascular Dorsalis pedis pulses palpable bilaterally. Posterior tibial pulses palpable bilaterally. Capillary refill normal to all digits.  No cyanosis or clubbing noted. Pedal hair growth normal.  Neurologic Normal speech. Oriented to person, place, and time. Epicritic sensation to light touch grossly present bilaterally.  Dermatologic Painful  ingrowing nail at medial nail borders of the hallux nail right. No other open wounds. No skin lesions.  Orthopedic: Normal joint ROM without pain or crepitus bilaterally. No visible deformities. No bony tenderness.   Radiographs: None Assessment:   1. Ingrown toenail of right foot    Plan:  Patient was evaluated and treated and all questions answered.  Ingrown Nail, right -Patient elects to proceed with minor surgery to remove ingrown toenail removal today. Consent reviewed and signed by patient. -Ingrown nail excised. See procedure note. -Educated on post-procedure care including soaking. Written instructions provided and reviewed. -Patient to follow up in 2 weeks for nail check.  Procedure: Excision of Ingrown Toenail Location: Right 1st toe medial nail borders. Anesthesia: Lidocaine 1% plain; 1.5 mL and Marcaine 0.5% plain; 1.5 mL, digital block. Skin Prep: Betadine. Dressing: Silvadene; telfa; dry, sterile, compression dressing. Technique: Following skin prep, the toe was exsanguinated and a tourniquet was secured at the base of the toe. The affected nail border was freed, split with a nail splitter, and excised. Chemical matrixectomy was then performed with phenol and irrigated out with alcohol. The tourniquet was then removed and sterile dressing applied. Disposition: Patient tolerated procedure well. Patient to return in 2 weeks for follow-up.   No follow-ups on file. Right medial ingrown
